# Patient Record
Sex: Male | Born: 1955 | Race: White | Hispanic: No | Marital: Married | Smoking: Never smoker
Health system: Southern US, Community
[De-identification: ages and names within clinical notes are randomized; demographics above are authoritative.]

## PROBLEM LIST (undated history)

## (undated) HISTORY — PX: KNEE SURGERY: SHX244

## (undated) HISTORY — PX: TONSILLECTOMY: SHX5217

---

## 2008-03-14 ENCOUNTER — Encounter: Admission: RE | Admit: 2008-03-14 | Discharge: 2008-03-14 | Payer: Self-pay | Admitting: Family Medicine

## 2008-10-26 ENCOUNTER — Encounter: Admission: RE | Admit: 2008-10-26 | Discharge: 2008-10-26 | Payer: Self-pay | Admitting: Family Medicine

## 2009-09-13 ENCOUNTER — Ambulatory Visit: Payer: Self-pay | Admitting: Genetic Counselor

## 2009-10-31 ENCOUNTER — Encounter: Admission: RE | Admit: 2009-10-31 | Discharge: 2009-10-31 | Payer: Self-pay | Admitting: Family Medicine

## 2010-09-23 ENCOUNTER — Other Ambulatory Visit: Payer: Self-pay | Admitting: Family Medicine

## 2010-09-23 DIAGNOSIS — D1800 Hemangioma unspecified site: Secondary | ICD-10-CM

## 2010-11-10 ENCOUNTER — Ambulatory Visit
Admission: RE | Admit: 2010-11-10 | Discharge: 2010-11-10 | Disposition: A | Discharge status: Home / Self Care | Payer: BC Managed Care – PPO | Source: Ambulatory Visit | Attending: Family Medicine | Admitting: Family Medicine

## 2010-11-10 DIAGNOSIS — D1800 Hemangioma unspecified site: Secondary | ICD-10-CM

## 2011-10-23 ENCOUNTER — Other Ambulatory Visit: Payer: Self-pay | Admitting: Family Medicine

## 2011-10-23 DIAGNOSIS — D1803 Hemangioma of intra-abdominal structures: Secondary | ICD-10-CM

## 2011-11-23 ENCOUNTER — Ambulatory Visit
Admission: RE | Admit: 2011-11-23 | Discharge: 2011-11-23 | Disposition: A | Discharge status: Home / Self Care | Payer: BC Managed Care – PPO | Source: Ambulatory Visit | Attending: Family Medicine | Admitting: Family Medicine

## 2011-11-23 DIAGNOSIS — D1803 Hemangioma of intra-abdominal structures: Secondary | ICD-10-CM

## 2012-11-09 ENCOUNTER — Other Ambulatory Visit: Payer: Self-pay | Admitting: Family Medicine

## 2012-11-09 DIAGNOSIS — D1803 Hemangioma of intra-abdominal structures: Secondary | ICD-10-CM

## 2012-11-09 DIAGNOSIS — Z8051 Family history of malignant neoplasm of kidney: Secondary | ICD-10-CM

## 2012-11-15 ENCOUNTER — Other Ambulatory Visit: Payer: Self-pay | Admitting: Family Medicine

## 2012-11-15 ENCOUNTER — Ambulatory Visit
Admission: RE | Admit: 2012-11-15 | Discharge: 2012-11-15 | Disposition: A | Discharge status: Home / Self Care | Payer: BC Managed Care – PPO | Source: Ambulatory Visit | Attending: Family Medicine | Admitting: Family Medicine

## 2012-11-15 ENCOUNTER — Ambulatory Visit: Admission: RE | Admit: 2012-11-15 | Payer: BC Managed Care – PPO | Source: Ambulatory Visit

## 2012-11-15 DIAGNOSIS — Z8051 Family history of malignant neoplasm of kidney: Secondary | ICD-10-CM

## 2012-11-15 DIAGNOSIS — D1803 Hemangioma of intra-abdominal structures: Secondary | ICD-10-CM

## 2014-01-04 ENCOUNTER — Other Ambulatory Visit: Payer: Self-pay | Admitting: Family Medicine

## 2014-01-04 DIAGNOSIS — D1803 Hemangioma of intra-abdominal structures: Secondary | ICD-10-CM

## 2014-02-22 ENCOUNTER — Ambulatory Visit
Admission: RE | Admit: 2014-02-22 | Discharge: 2014-02-22 | Disposition: A | Discharge status: Home / Self Care | Payer: BC Managed Care – PPO | Source: Ambulatory Visit | Attending: Family Medicine | Admitting: Family Medicine

## 2014-02-22 DIAGNOSIS — D1803 Hemangioma of intra-abdominal structures: Secondary | ICD-10-CM

## 2014-11-28 ENCOUNTER — Other Ambulatory Visit: Payer: Self-pay | Admitting: Family Medicine

## 2014-11-28 DIAGNOSIS — D1803 Hemangioma of intra-abdominal structures: Secondary | ICD-10-CM

## 2015-02-25 ENCOUNTER — Ambulatory Visit
Admission: RE | Admit: 2015-02-25 | Discharge: 2015-02-25 | Disposition: A | Discharge status: Home / Self Care | Payer: BLUE CROSS/BLUE SHIELD | Source: Ambulatory Visit | Attending: Family Medicine | Admitting: Family Medicine

## 2015-02-25 DIAGNOSIS — D1803 Hemangioma of intra-abdominal structures: Secondary | ICD-10-CM

## 2016-03-05 ENCOUNTER — Other Ambulatory Visit: Payer: Self-pay | Admitting: Family Medicine

## 2016-03-05 DIAGNOSIS — D1803 Hemangioma of intra-abdominal structures: Secondary | ICD-10-CM

## 2016-03-05 DIAGNOSIS — Z8051 Family history of malignant neoplasm of kidney: Secondary | ICD-10-CM

## 2016-04-17 ENCOUNTER — Ambulatory Visit
Admission: RE | Admit: 2016-04-17 | Discharge: 2016-04-17 | Disposition: A | Discharge status: Home / Self Care | Payer: BLUE CROSS/BLUE SHIELD | Source: Ambulatory Visit | Attending: Family Medicine | Admitting: Family Medicine

## 2016-04-17 DIAGNOSIS — D1803 Hemangioma of intra-abdominal structures: Secondary | ICD-10-CM

## 2016-04-17 DIAGNOSIS — Z8051 Family history of malignant neoplasm of kidney: Secondary | ICD-10-CM

## 2016-04-20 ENCOUNTER — Other Ambulatory Visit (HOSPITAL_COMMUNITY): Payer: Self-pay | Admitting: Urology

## 2016-04-20 DIAGNOSIS — D3000 Benign neoplasm of unspecified kidney: Secondary | ICD-10-CM

## 2016-04-23 ENCOUNTER — Ambulatory Visit (HOSPITAL_COMMUNITY)
Admission: RE | Admit: 2016-04-23 | Discharge: 2016-04-23 | Disposition: A | Discharge status: Home / Self Care | Payer: BLUE CROSS/BLUE SHIELD | Source: Ambulatory Visit | Attending: Urology | Admitting: Urology

## 2016-04-23 DIAGNOSIS — K760 Fatty (change of) liver, not elsewhere classified: Secondary | ICD-10-CM | POA: Diagnosis not present

## 2016-04-23 DIAGNOSIS — D3002 Benign neoplasm of left kidney: Secondary | ICD-10-CM | POA: Diagnosis not present

## 2016-04-23 DIAGNOSIS — D3000 Benign neoplasm of unspecified kidney: Secondary | ICD-10-CM

## 2016-04-23 MED ORDER — GADOBENATE DIMEGLUMINE 529 MG/ML IV SOLN
20.0000 mL | Freq: Once | INTRAVENOUS | Status: AC | PRN
  Administered 2016-04-23: 20 mL via INTRAVENOUS

## 2016-08-05 DIAGNOSIS — Z0100 Encounter for examination of eyes and vision without abnormal findings: Secondary | ICD-10-CM | POA: Diagnosis not present

## 2017-02-11 ENCOUNTER — Other Ambulatory Visit: Payer: Self-pay | Admitting: Family Medicine

## 2017-02-11 DIAGNOSIS — Z8051 Family history of malignant neoplasm of kidney: Secondary | ICD-10-CM

## 2017-04-14 ENCOUNTER — Ambulatory Visit
Admission: RE | Admit: 2017-04-14 | Discharge: 2017-04-14 | Disposition: A | Discharge status: Home / Self Care | Payer: 59 | Source: Ambulatory Visit | Attending: Family Medicine | Admitting: Family Medicine

## 2017-04-14 DIAGNOSIS — Z8051 Family history of malignant neoplasm of kidney: Secondary | ICD-10-CM

## 2017-11-01 IMAGING — US US ABDOMEN COMPLETE
1 series · 13 of 25 positions shown · non-contrast
Comparison: Abdominal ultrasound February 25, 2015 and February 22, 2014.

CLINICAL DATA: Family history of renal cell malignancy. History of
presumed hepatic hemangioma in the caudate lobe of the liver

EXAM:
ABDOMEN ULTRASOUND COMPLETE

[Series 1: us abdomen complete · 0.32mm/px · 13 of 88 slices shown]
[im 1/88]
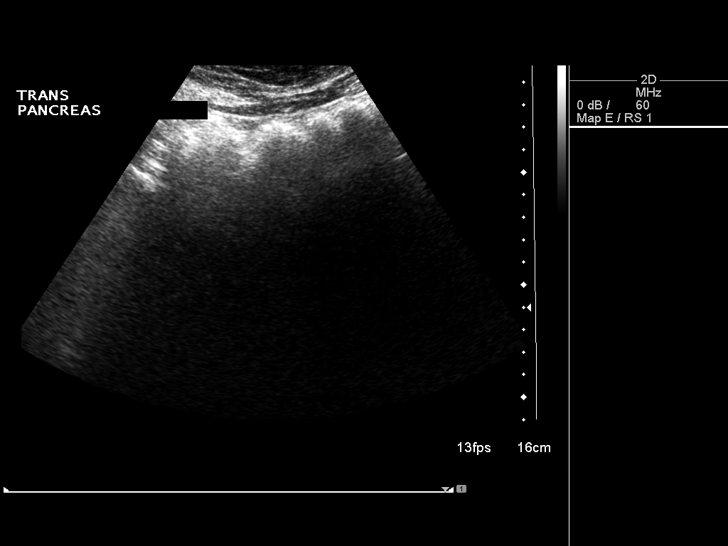
[im 8/88]
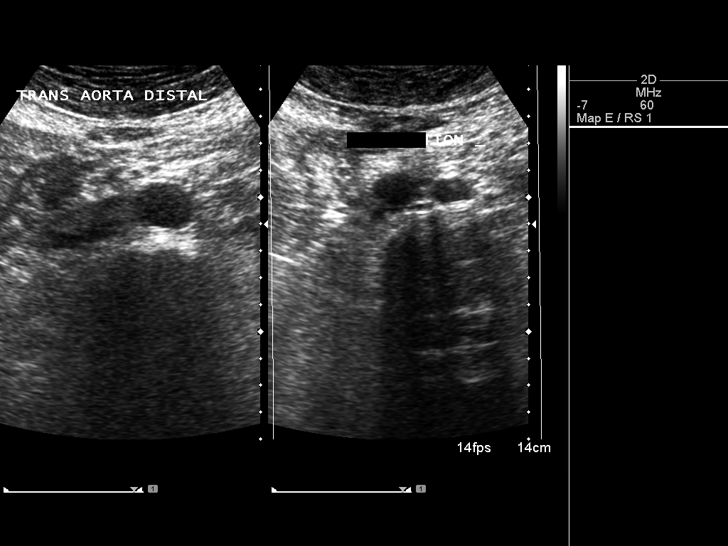
[im 15/88]
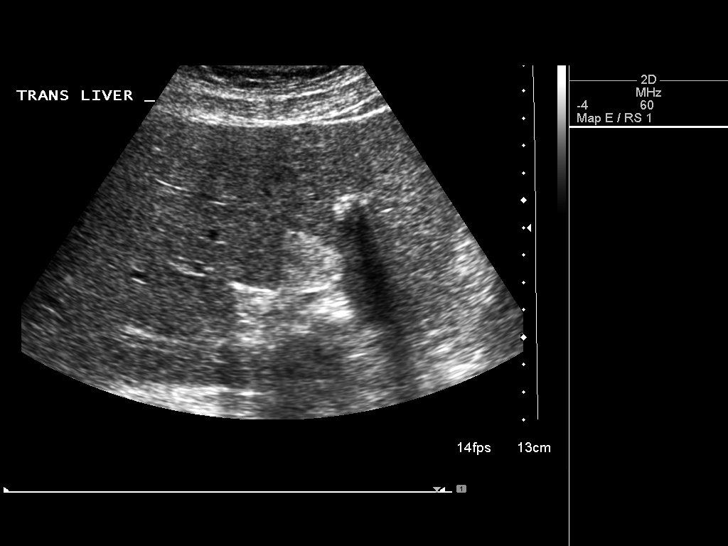
[im 22/88]
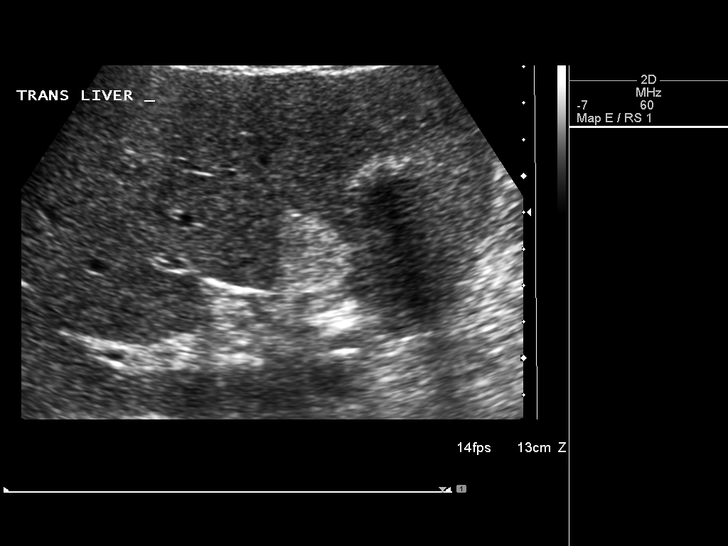
[im 30/88]
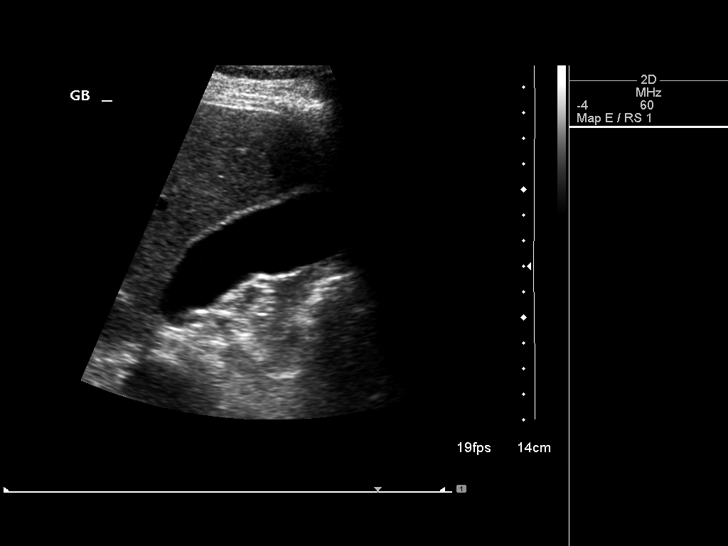
[im 37/88]
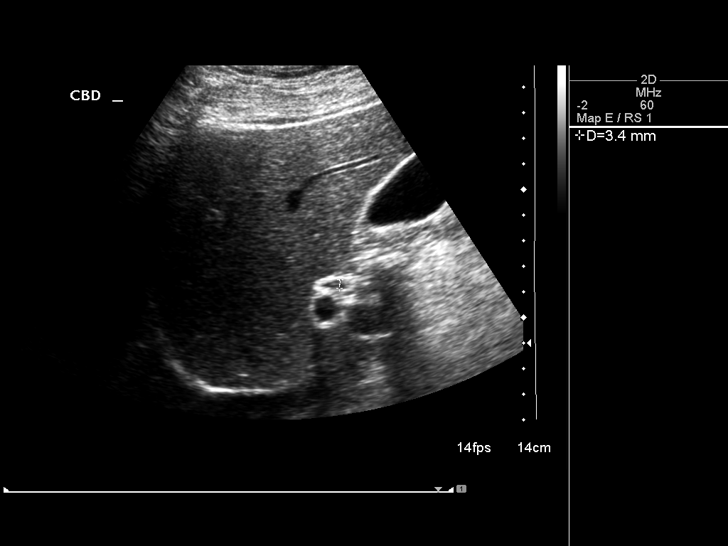
[im 44/88]
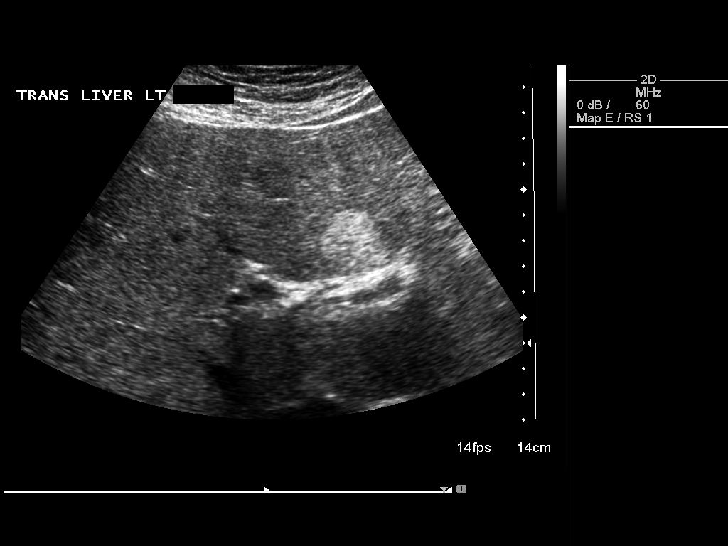
[im 51/88]
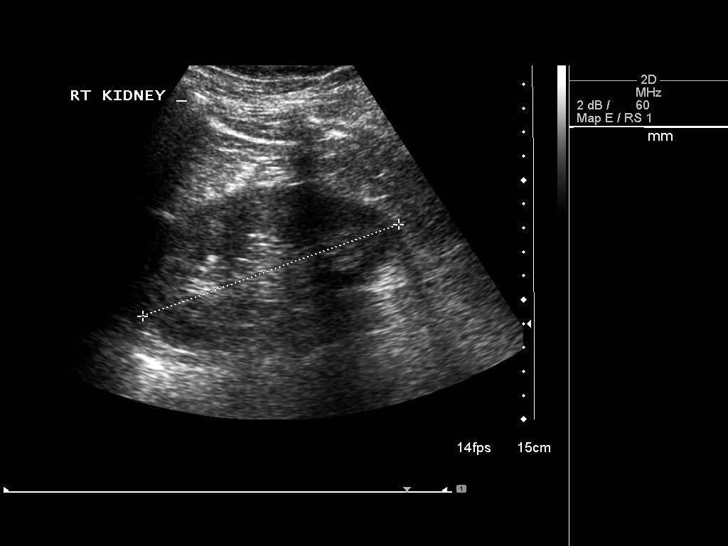
[im 59/88]
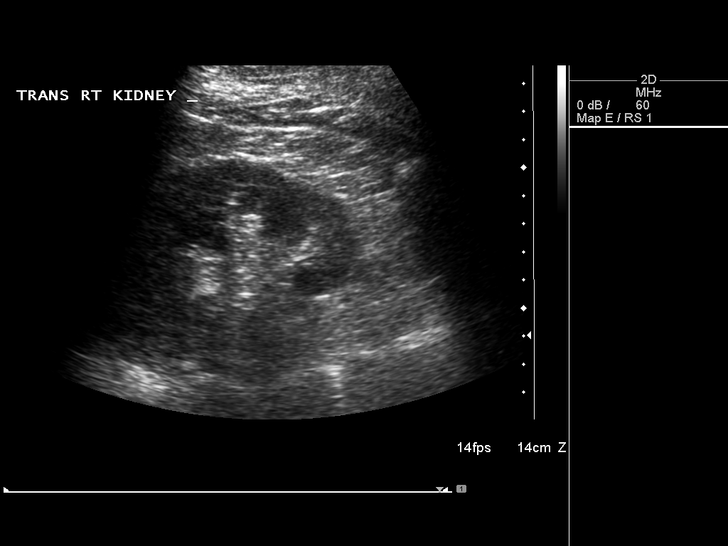
[im 66/88]
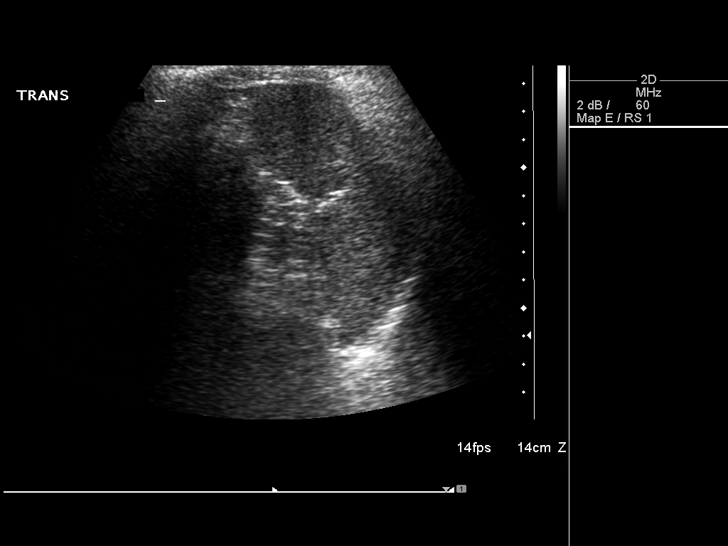
[im 73/88]
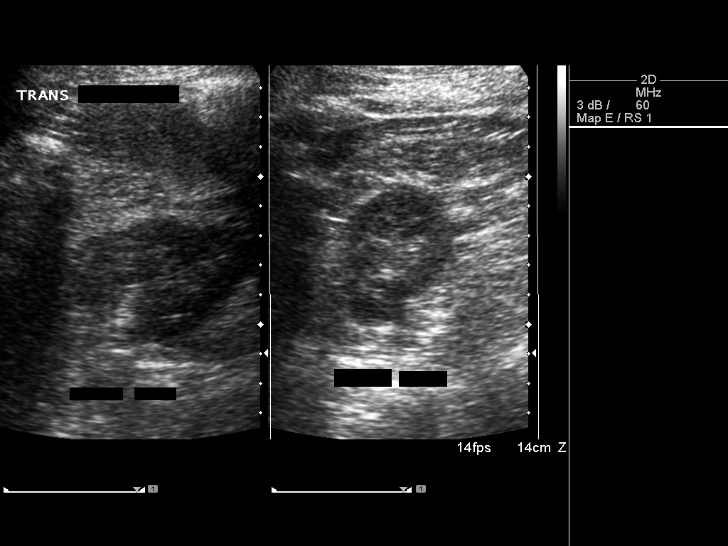
[im 80/88]
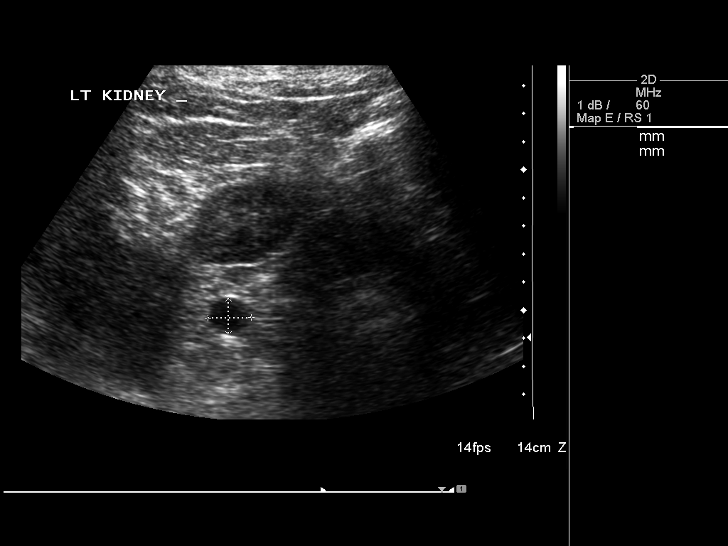
[im 88/88]
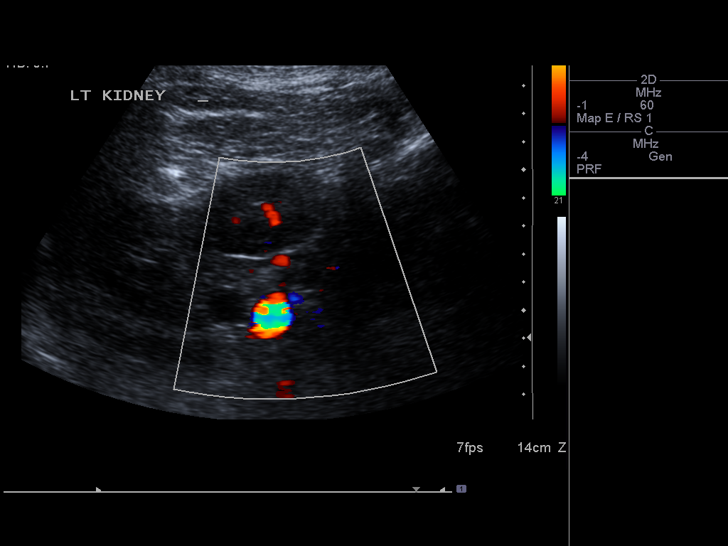

[13 of 25 positions shown; findings below may reference images not displayed]

FINDINGS: Gallbladder: No gallstones or wall thickening visualized. No
sonographic Murphy sign noted by sonographer.

Common bile duct: Diameter: 3.4 mm

Liver: In the caudate lobe of the liver there is a 3.2 x 1.9 x
cm solid hyperechoic focus. It is slightly larger than on the 8423
in 9213 studies but is similar to or smaller than that seen in 8984.
The remainder the liver is unremarkable.

IVC: Partially obscured by bowel gas.

Pancreas: The pancreas was obscured by bowel gas.

Spleen: Size and appearance within normal limits.

Right Kidney: Length: 11.4 cm. Echogenicity within normal limits. No
mass or hydronephrosis visualized.

Left Kidney: Length: 11.6 cm. The renal cortical echotexture is
normal. There is a 1.5 x 1.3 x 1.4 cm cyst in the medial aspect of
the midpole with a subtle 2 mm hyperechoic mural nodule or
calcification. There is no hydronephrosis.

Abdominal aorta: Bowel gas obscures the proximal aorta. The
remainder appears normal.

Other findings: No ascites.
IMPRESSION: Again demonstrated is the hyperechoic caudate lobe mass presumed to
be a hemangioma. It is slightly larger than on the 8423 in 9213
studies but is similar tour smaller than that seen in 8984.

**An incidental finding of potential clinical significance has been
found. There is a 1.5 cm maximal dimension cystic structure with a
tinymural nodule noted medially in the mid pole of the left kidney
that has not been previously described. Given the patient's strong
family history of renal cell malignancy, renal protocol MRI or CT
scanning is recommended further evaluate this structure. **

These results will be called to the ordering clinician or
representative by the Radiologist Assistant, and communication
documented in the PACS or zVision Dashboard.

## 2018-05-17 ENCOUNTER — Other Ambulatory Visit: Payer: Self-pay | Admitting: Family Medicine

## 2018-05-17 DIAGNOSIS — Z8051 Family history of malignant neoplasm of kidney: Secondary | ICD-10-CM

## 2018-05-26 ENCOUNTER — Other Ambulatory Visit: Payer: 59

## 2018-06-10 ENCOUNTER — Ambulatory Visit
Admission: RE | Admit: 2018-06-10 | Discharge: 2018-06-10 | Disposition: A | Discharge status: Home / Self Care | Payer: BLUE CROSS/BLUE SHIELD | Source: Ambulatory Visit | Attending: Family Medicine | Admitting: Family Medicine

## 2018-06-10 DIAGNOSIS — Z0000 Encounter for general adult medical examination without abnormal findings: Secondary | ICD-10-CM | POA: Diagnosis not present

## 2018-06-10 DIAGNOSIS — K760 Fatty (change of) liver, not elsewhere classified: Secondary | ICD-10-CM | POA: Diagnosis not present

## 2018-06-10 DIAGNOSIS — R7309 Other abnormal glucose: Secondary | ICD-10-CM | POA: Diagnosis not present

## 2018-06-10 DIAGNOSIS — Z8051 Family history of malignant neoplasm of kidney: Secondary | ICD-10-CM

## 2018-06-10 DIAGNOSIS — Z13220 Encounter for screening for lipoid disorders: Secondary | ICD-10-CM | POA: Diagnosis not present

## 2018-06-10 DIAGNOSIS — E039 Hypothyroidism, unspecified: Secondary | ICD-10-CM | POA: Diagnosis not present

## 2018-06-10 DIAGNOSIS — N281 Cyst of kidney, acquired: Secondary | ICD-10-CM | POA: Diagnosis not present

## 2018-06-10 DIAGNOSIS — Z125 Encounter for screening for malignant neoplasm of prostate: Secondary | ICD-10-CM | POA: Diagnosis not present

## 2018-06-13 ENCOUNTER — Encounter: Payer: Self-pay | Admitting: Urology

## 2018-09-23 DIAGNOSIS — Z0100 Encounter for examination of eyes and vision without abnormal findings: Secondary | ICD-10-CM | POA: Diagnosis not present

## 2018-11-28 DIAGNOSIS — M2550 Pain in unspecified joint: Secondary | ICD-10-CM | POA: Diagnosis not present

## 2019-01-20 DIAGNOSIS — M2550 Pain in unspecified joint: Secondary | ICD-10-CM | POA: Diagnosis not present

## 2019-01-25 DIAGNOSIS — M25512 Pain in left shoulder: Secondary | ICD-10-CM | POA: Diagnosis not present

## 2019-01-25 DIAGNOSIS — M25511 Pain in right shoulder: Secondary | ICD-10-CM | POA: Diagnosis not present

## 2019-03-13 DIAGNOSIS — R768 Other specified abnormal immunological findings in serum: Secondary | ICD-10-CM | POA: Diagnosis not present

## 2019-03-13 DIAGNOSIS — M150 Primary generalized (osteo)arthritis: Secondary | ICD-10-CM | POA: Diagnosis not present

## 2019-06-06 DIAGNOSIS — M25511 Pain in right shoulder: Secondary | ICD-10-CM | POA: Diagnosis not present

## 2019-07-07 DIAGNOSIS — Z86010 Personal history of colonic polyps: Secondary | ICD-10-CM | POA: Diagnosis not present

## 2019-07-07 DIAGNOSIS — K6289 Other specified diseases of anus and rectum: Secondary | ICD-10-CM | POA: Diagnosis not present

## 2019-07-13 DIAGNOSIS — M24111 Other articular cartilage disorders, right shoulder: Secondary | ICD-10-CM | POA: Diagnosis not present

## 2019-07-13 DIAGNOSIS — G8918 Other acute postprocedural pain: Secondary | ICD-10-CM | POA: Diagnosis not present

## 2019-07-13 DIAGNOSIS — S43431A Superior glenoid labrum lesion of right shoulder, initial encounter: Secondary | ICD-10-CM | POA: Diagnosis not present

## 2019-07-13 DIAGNOSIS — M7541 Impingement syndrome of right shoulder: Secondary | ICD-10-CM | POA: Diagnosis not present

## 2019-07-13 DIAGNOSIS — S46191A Other injury of muscle, fascia and tendon of long head of biceps, right arm, initial encounter: Secondary | ICD-10-CM | POA: Diagnosis not present

## 2019-07-13 DIAGNOSIS — M7551 Bursitis of right shoulder: Secondary | ICD-10-CM | POA: Diagnosis not present

## 2019-07-13 DIAGNOSIS — S46011A Strain of muscle(s) and tendon(s) of the rotator cuff of right shoulder, initial encounter: Secondary | ICD-10-CM | POA: Diagnosis not present

## 2019-07-13 DIAGNOSIS — M7521 Bicipital tendinitis, right shoulder: Secondary | ICD-10-CM | POA: Diagnosis not present

## 2019-07-13 DIAGNOSIS — M75111 Incomplete rotator cuff tear or rupture of right shoulder, not specified as traumatic: Secondary | ICD-10-CM | POA: Diagnosis not present

## 2019-07-19 DIAGNOSIS — M25511 Pain in right shoulder: Secondary | ICD-10-CM | POA: Diagnosis not present

## 2019-07-19 DIAGNOSIS — M25611 Stiffness of right shoulder, not elsewhere classified: Secondary | ICD-10-CM | POA: Diagnosis not present

## 2019-07-26 DIAGNOSIS — Z4789 Encounter for other orthopedic aftercare: Secondary | ICD-10-CM | POA: Diagnosis not present

## 2019-08-02 DIAGNOSIS — M25611 Stiffness of right shoulder, not elsewhere classified: Secondary | ICD-10-CM | POA: Diagnosis not present

## 2019-08-02 DIAGNOSIS — M25511 Pain in right shoulder: Secondary | ICD-10-CM | POA: Diagnosis not present

## 2019-08-24 DIAGNOSIS — M25611 Stiffness of right shoulder, not elsewhere classified: Secondary | ICD-10-CM | POA: Diagnosis not present

## 2019-08-24 DIAGNOSIS — M25511 Pain in right shoulder: Secondary | ICD-10-CM | POA: Diagnosis not present

## 2019-10-13 ENCOUNTER — Other Ambulatory Visit: Payer: Self-pay | Admitting: Family Medicine

## 2019-10-13 DIAGNOSIS — Z8051 Family history of malignant neoplasm of kidney: Secondary | ICD-10-CM

## 2019-11-03 ENCOUNTER — Ambulatory Visit
Admission: RE | Admit: 2019-11-03 | Discharge: 2019-11-03 | Disposition: A | Discharge status: Home / Self Care | Payer: No Typology Code available for payment source | Source: Ambulatory Visit | Attending: Family Medicine | Admitting: Family Medicine

## 2019-11-03 DIAGNOSIS — Z8051 Family history of malignant neoplasm of kidney: Secondary | ICD-10-CM

## 2020-11-25 ENCOUNTER — Other Ambulatory Visit (HOSPITAL_COMMUNITY): Payer: Self-pay | Admitting: *Deleted

## 2020-11-29 ENCOUNTER — Ambulatory Visit (HOSPITAL_BASED_OUTPATIENT_CLINIC_OR_DEPARTMENT_OTHER)
Admission: RE | Admit: 2020-11-29 | Discharge: 2020-11-29 | Disposition: A | Discharge status: Home / Self Care | Payer: No Typology Code available for payment source | Source: Ambulatory Visit | Attending: Cardiology | Admitting: Cardiology

## 2020-11-29 ENCOUNTER — Other Ambulatory Visit: Payer: Self-pay

## 2020-12-06 ENCOUNTER — Other Ambulatory Visit: Payer: Self-pay | Admitting: Internal Medicine

## 2020-12-06 DIAGNOSIS — Z8051 Family history of malignant neoplasm of kidney: Secondary | ICD-10-CM

## 2021-01-03 ENCOUNTER — Ambulatory Visit
Admission: RE | Admit: 2021-01-03 | Discharge: 2021-01-03 | Disposition: A | Discharge status: Home / Self Care | Payer: No Typology Code available for payment source | Source: Ambulatory Visit | Attending: Internal Medicine | Admitting: Internal Medicine

## 2021-01-03 DIAGNOSIS — Z8051 Family history of malignant neoplasm of kidney: Secondary | ICD-10-CM

## 2022-06-15 IMAGING — CT CT CARDIAC CORONARY ARTERY CALCIUM SCORE
2 series · 16 of 20 positions shown, 18 images · non-contrast
Comparison: Chest radiograph 11/10/2010
COMPARISON: Chest radiograph 11/10/2010

Addendum:
EXAM:
OVER-READ INTERPRETATION  CT CHEST

The following report is an over-read performed by radiologist Dr.
Melynda Billiot [REDACTED] on 11/29/2020. This over-read
does not include interpretation of cardiac or coronary anatomy or
pathology. The calcium score interpretation by the cardiologist is
attached.
CLINICAL DATA: 64M for risk stratification
Coronary Calcium Score
TECHNIQUE: The patient was scanned on a Siemens Force scanner. Axial
non-contrast 3 mm slices were carried out through the heart. The
data set was analyzed on a dedicated work station and scored using
the Agatson method.

[Series 2: casc 3.0 i36f 2 bestdiast 72 % · axial · 0.46mm/px · z∈[-280,-178]mm · 8 of 44 slices shown, 10 images]
[im 5/44  vessel]
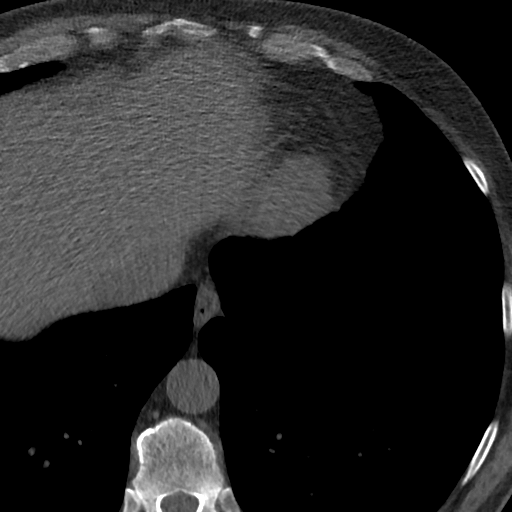
[im 5/44  lung]
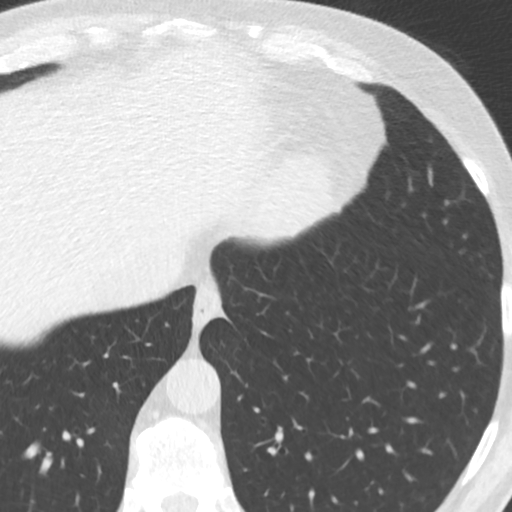
[im 10/44  vessel]
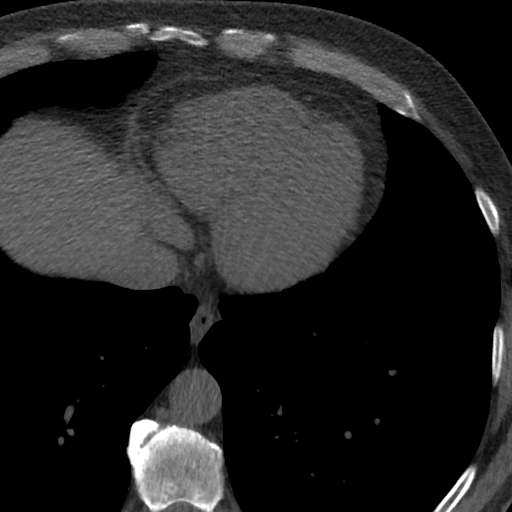
[im 15/44  vessel]
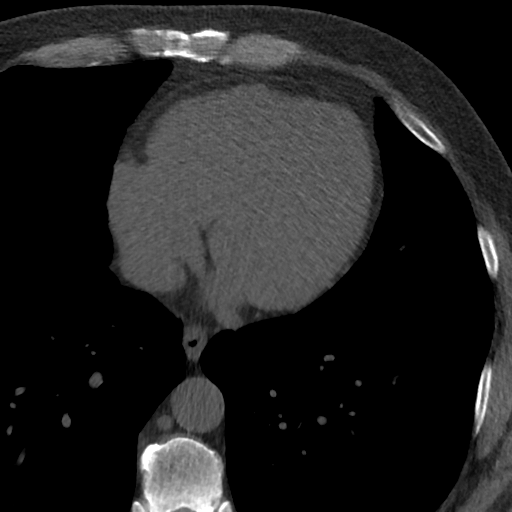
[im 20/44  vessel]
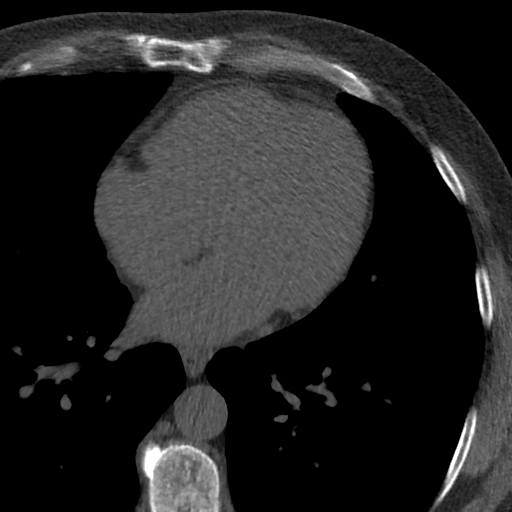
[im 24/44  vessel]
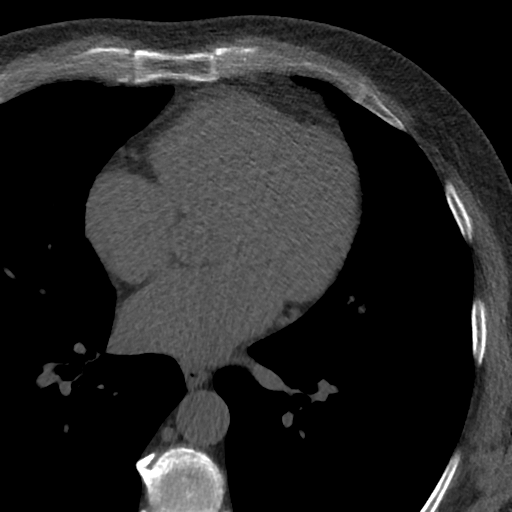
[im 24/44  lung]
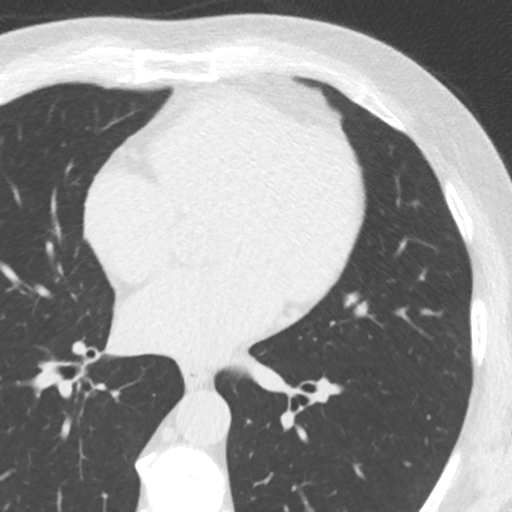
[im 29/44  vessel]
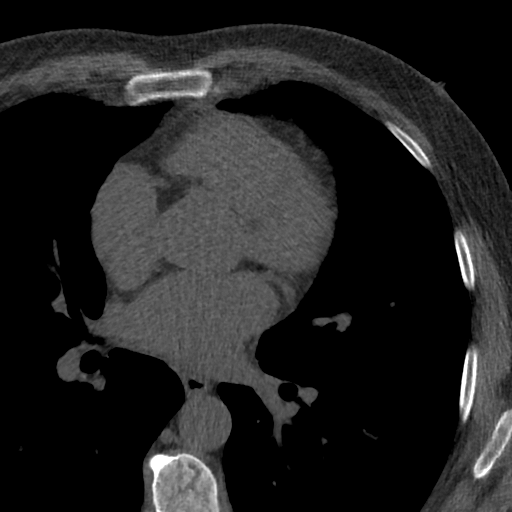
[im 34/44  vessel]
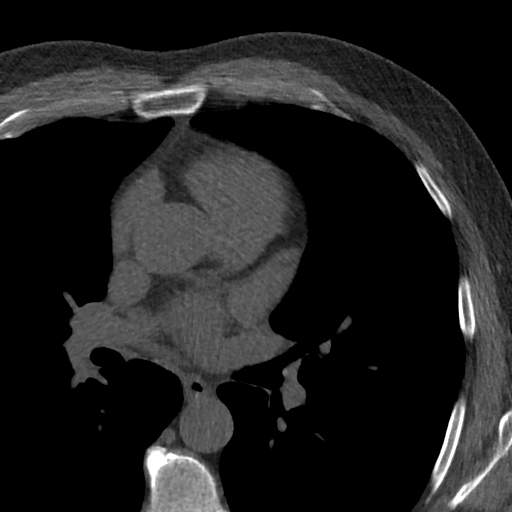
[im 39/44  vessel]
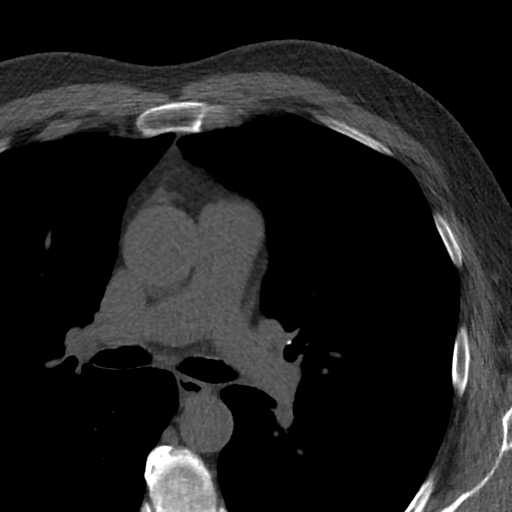

[Series 4: lung st 72 % · axial · 0.83mm/px · z∈[-280,-178]mm · 8 of 44 slices shown]
[im 5/44  lung]
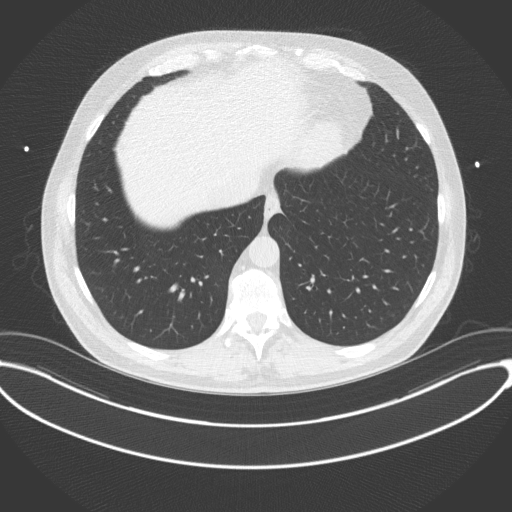
[im 10/44  lung]
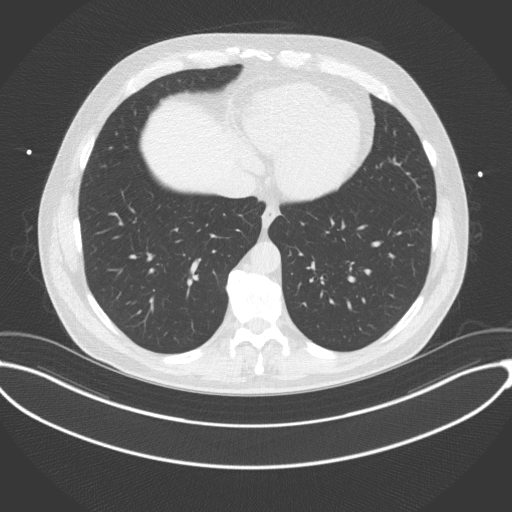
[im 15/44  lung]
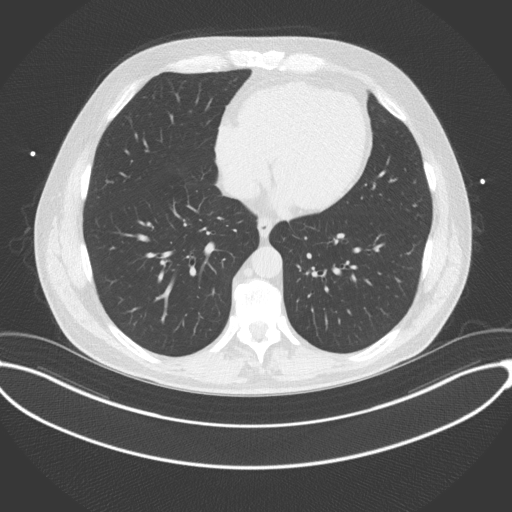
[im 20/44  lung]
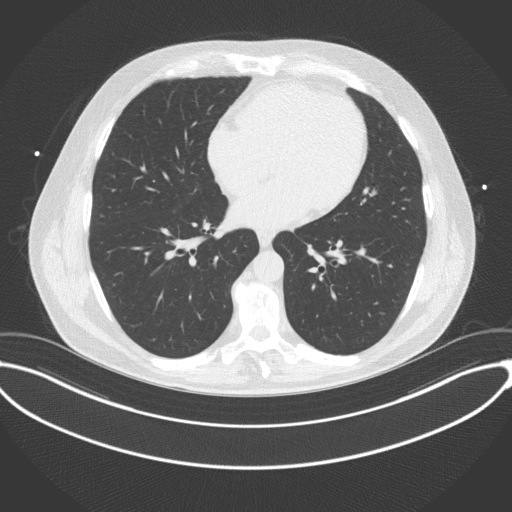
[im 24/44  lung]
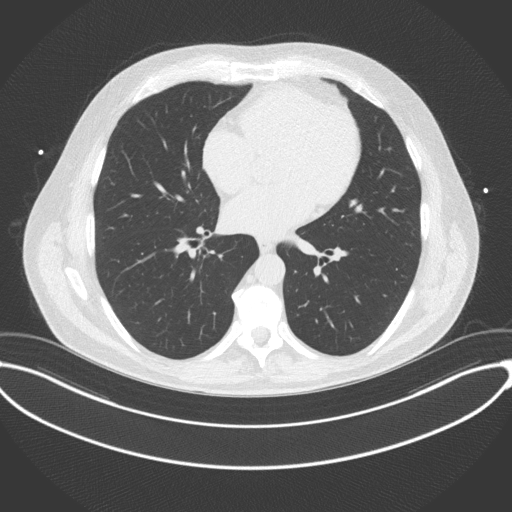
[im 29/44  lung]
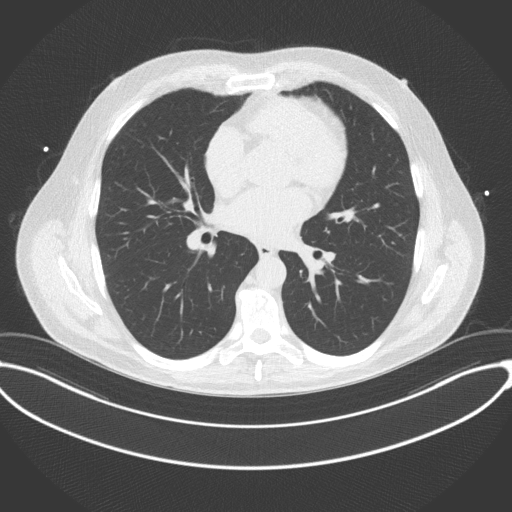
[im 34/44  lung]
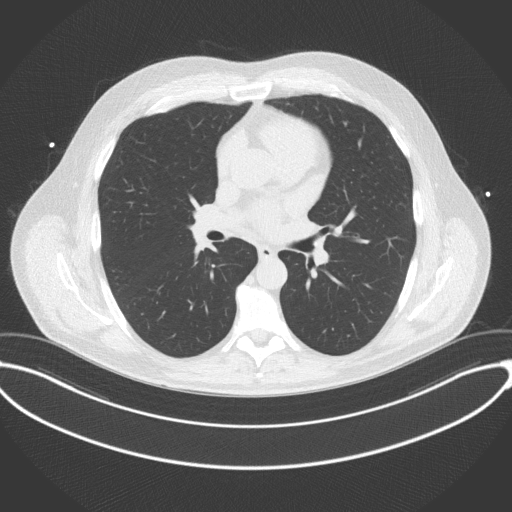
[im 39/44  lung]
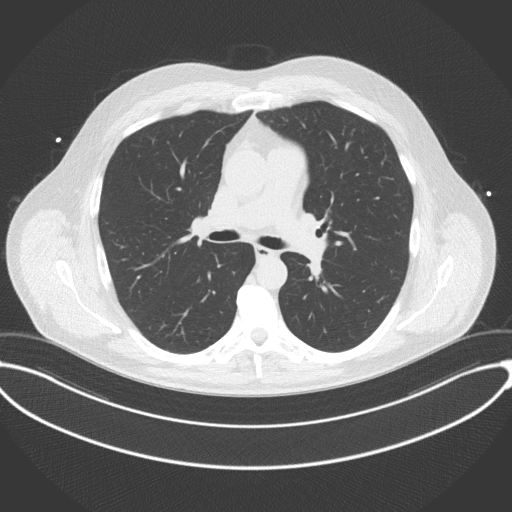

[16 of 20 positions shown; findings below may reference images not displayed]

FINDINGS: Vascular: Normal aortic caliber.

Mediastinum/Nodes: No imaged thoracic adenopathy.

Lungs/Pleura: No pleural fluid. Right middle lobe subpleural 2 mm
pulmonary nodule on [DATE].

More caudal right middle lobe 2 mm nodule on 38/3.

Upper Abdomen: Normal imaged portions of the liver, stomach.

Musculoskeletal: No acute osseous abnormality.
IMPRESSION: 1. No acute findings in the imaged extracardiac chest.
2. Tiny right middle lobe pulmonary nodules which are likely
subpleural lymph nodes. No follow-up needed if patient is low-risk.
Non-contrast chest CT can be considered in 12 months if patient is
high-risk. This recommendation follows the consensus statement:
Guidelines for Management of Incidental Pulmonary Nodules Detected
FINDINGS: Non-cardiac: See separate report from [REDACTED].

Ascending Aorta: Ascending aorta normal caliber.

Pericardium: Normal

Coronary arteries: Normal origins and anatomy. Left dominant. Scant
calcification in the proximal LAD.
IMPRESSION: Coronary calcium score of 1.51. This was 24th percentile for age and
sex matched control.

LM: 0
LAD:

LCX: 0
RCA: 0

*** End of Addendum ***
EXAM:
OVER-READ INTERPRETATION  CT CHEST

The following report is an over-read performed by radiologist Dr.
Melynda Billiot [REDACTED] on 11/29/2020. This over-read
does not include interpretation of cardiac or coronary anatomy or
pathology. The calcium score interpretation by the cardiologist is
attached.
FINDINGS: Vascular: Normal aortic caliber.

Mediastinum/Nodes: No imaged thoracic adenopathy.

Lungs/Pleura: No pleural fluid. Right middle lobe subpleural 2 mm
pulmonary nodule on [DATE].

More caudal right middle lobe 2 mm nodule on 38/3.

Upper Abdomen: Normal imaged portions of the liver, stomach.

Musculoskeletal: No acute osseous abnormality.
IMPRESSION: 1. No acute findings in the imaged extracardiac chest.
2. Tiny right middle lobe pulmonary nodules which are likely
subpleural lymph nodes. No follow-up needed if patient is low-risk.
Non-contrast chest CT can be considered in 12 months if patient is
high-risk. This recommendation follows the consensus statement:
Guidelines for Management of Incidental Pulmonary Nodules Detected

## 2022-07-20 IMAGING — US US ABDOMEN COMPLETE
1 series · 13 of 25 positions shown · non-contrast
Comparison: 11/03/2019

CLINICAL DATA: Follow-up renal cyst

EXAM:
ABDOMEN ULTRASOUND COMPLETE

[Series 1: us abdomen complete · 0.18mm/px · 88 acquisitions, 13 frames shown]
[im 1/88]
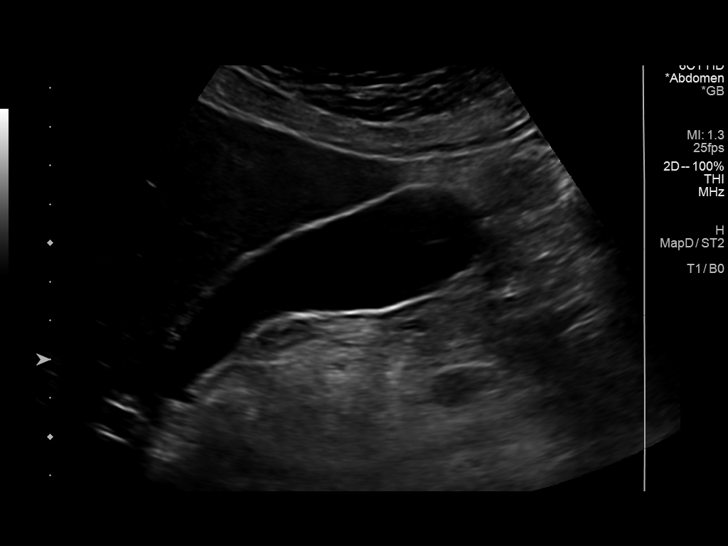
[im 8/88]
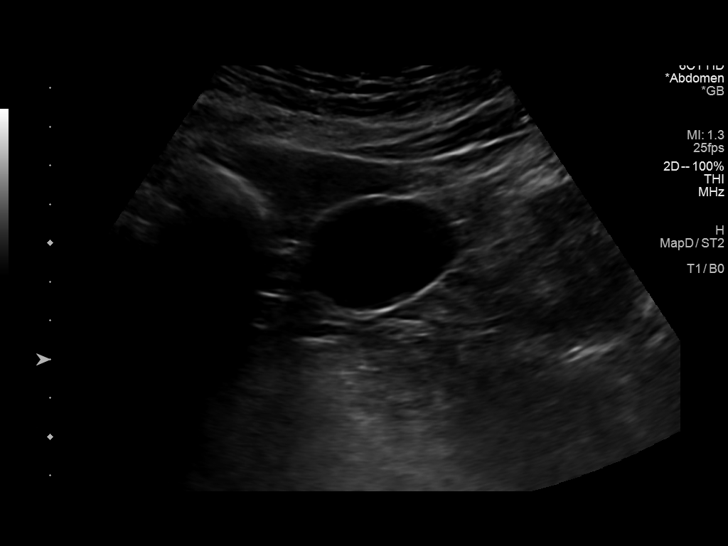
[im 15/88]
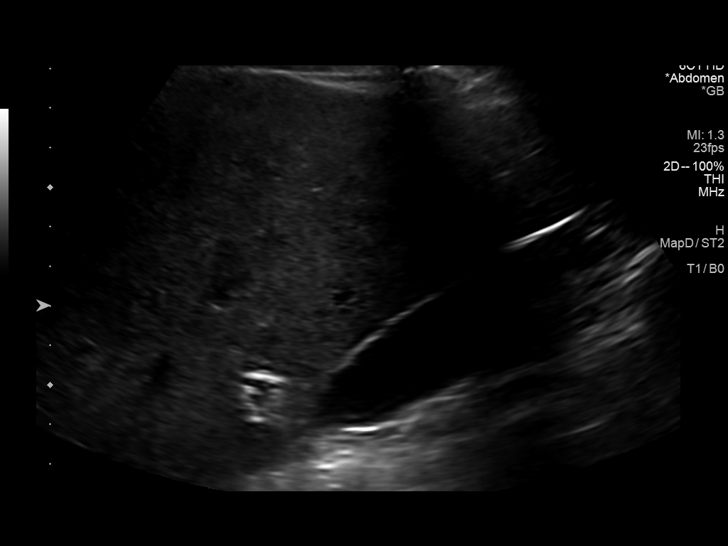
[im 22/88]
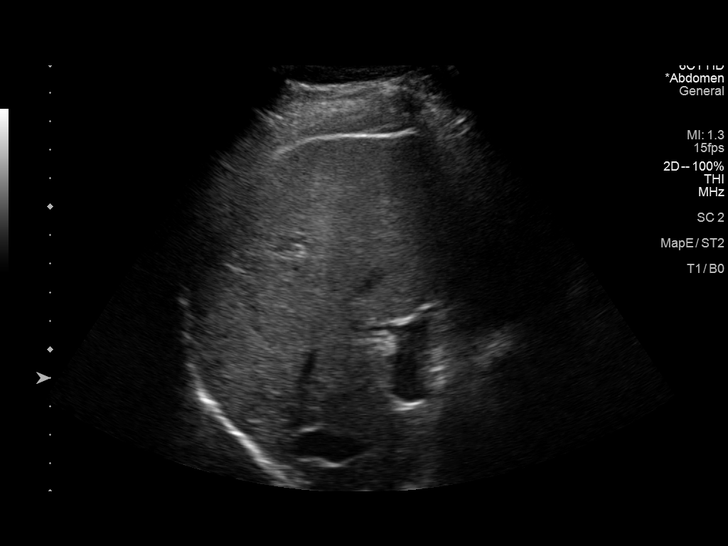
[im 30/88]
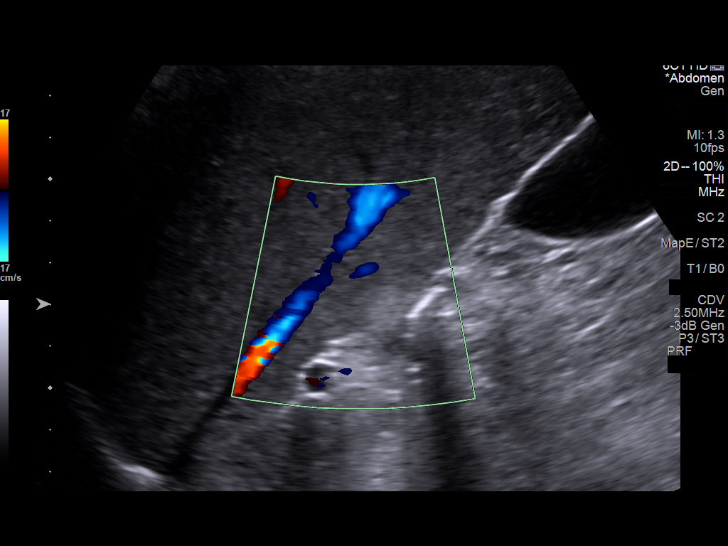
[im 37/88]
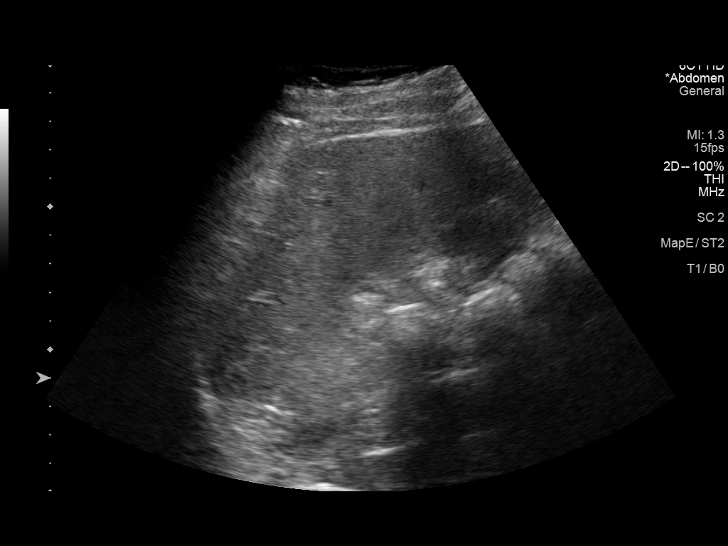
[im 44/88]
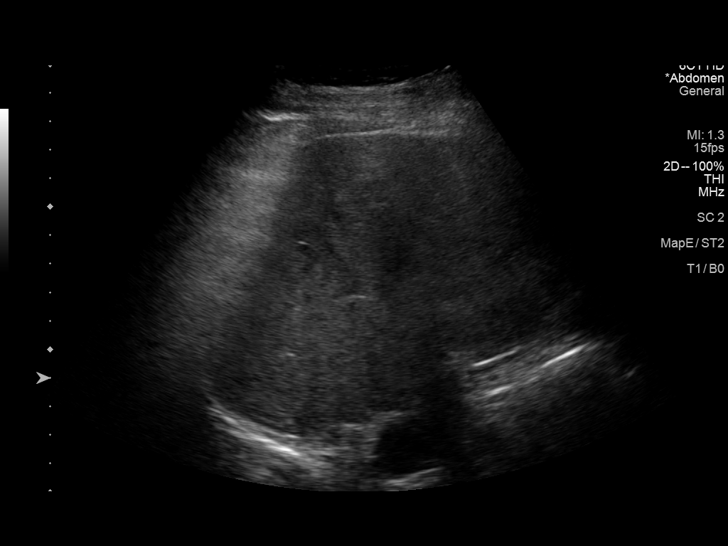
[im 51/88]
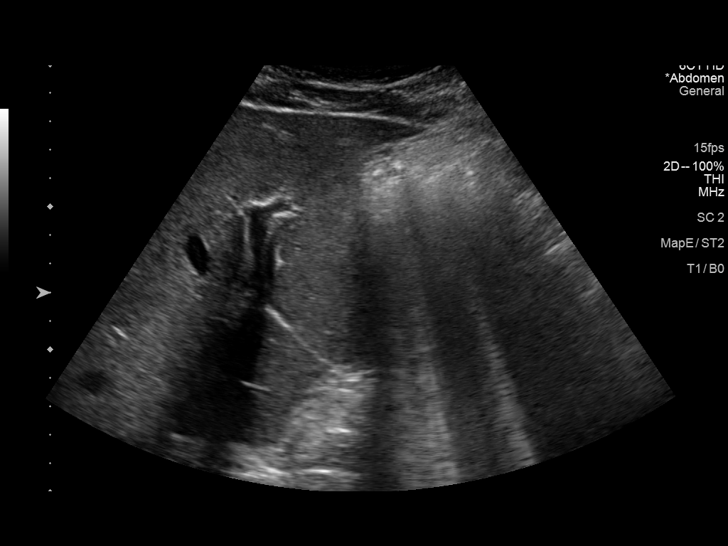
[im 59/88]
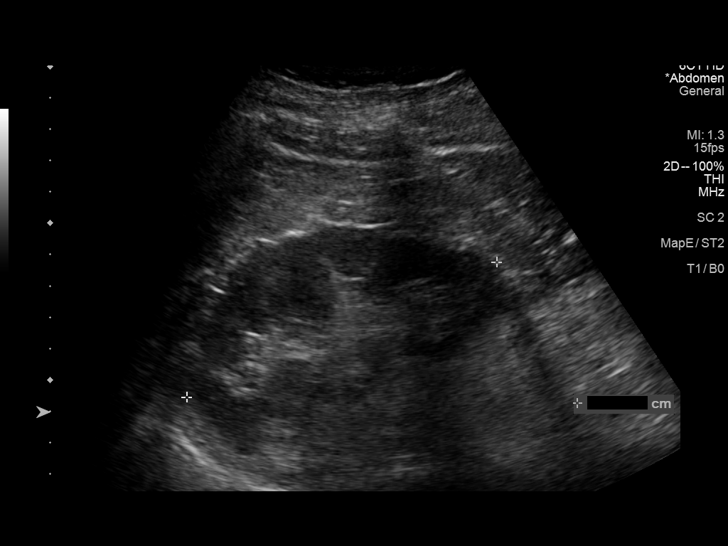
[im 66/88]
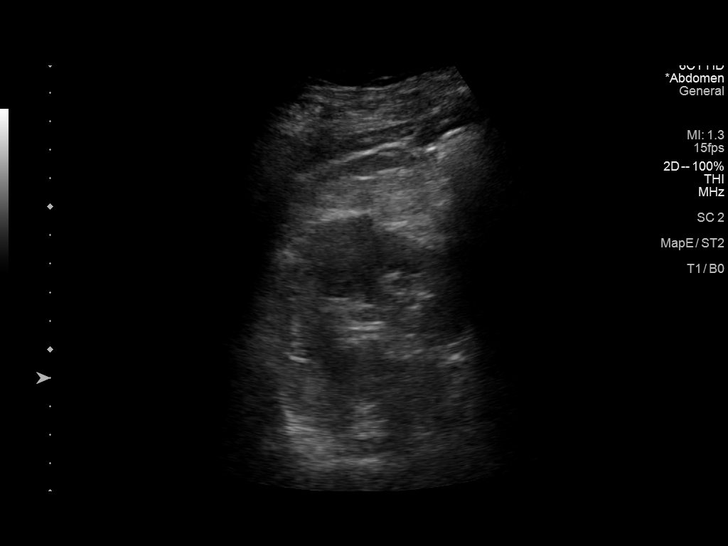
[im 73/88]
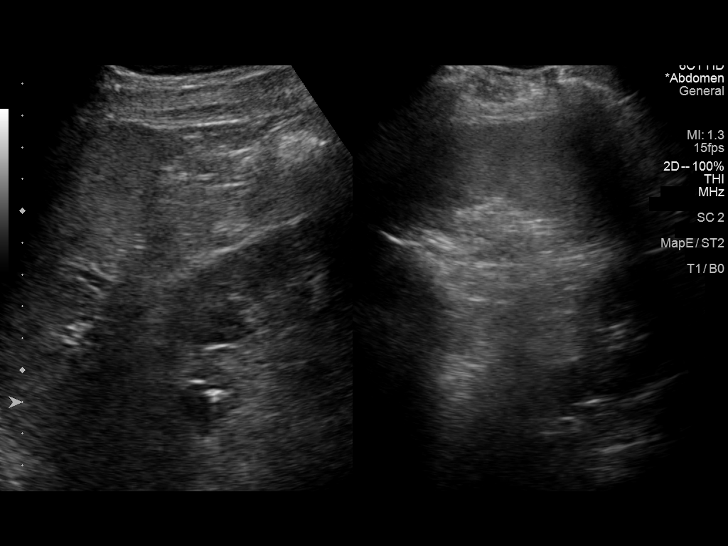
[im 80/88]
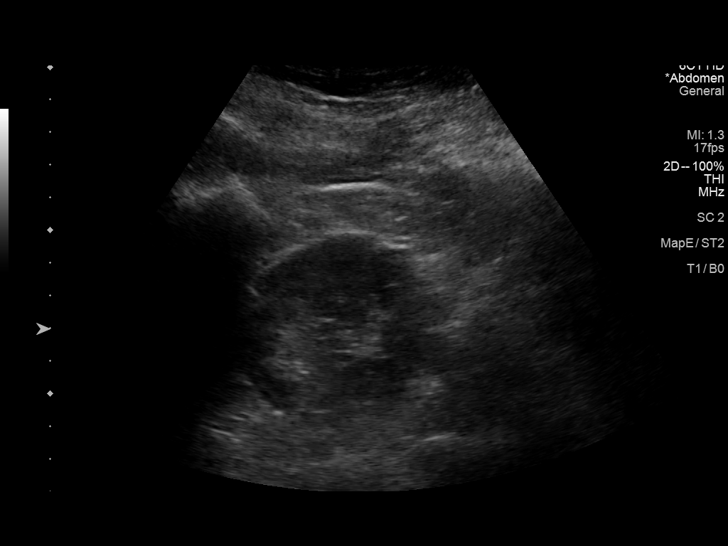
[im 88/88]
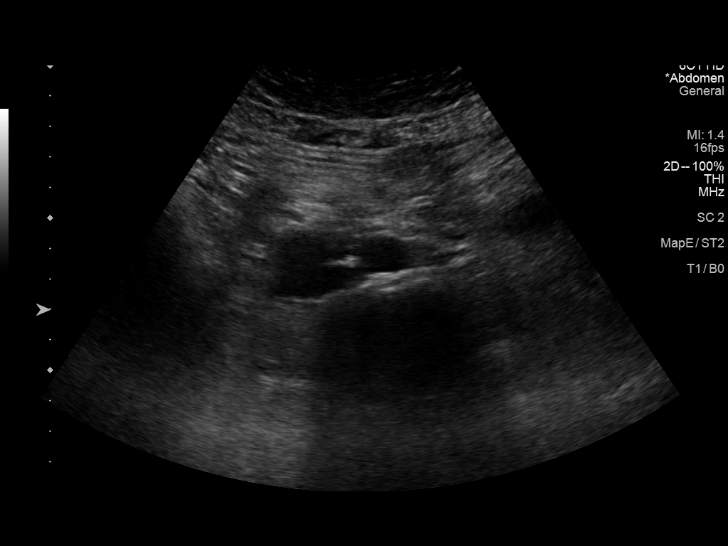

[13 of 25 positions shown; findings below may reference images not displayed]

FINDINGS: Gallbladder: No gallstones or wall thickening visualized. No
sonographic Murphy sign noted by sonographer.

Common bile duct: Diameter: 3.6 mm.

Liver: Mildly heterogeneous without focal mass. A hyperechoic focus
is noted measuring 3.4 x 2.1 cm. This is stable in appearance from
prior ultrasound as well as MRI from 3762 consistent with focal
fatty infiltration. Portal vein is patent on color Doppler imaging
with normal direction of blood flow towards the liver.

IVC: No abnormality visualized.

Pancreas: Visualized portion unremarkable.

Spleen: Size and appearance within normal limits.

Right Kidney: Length: 10.8 cm. Echogenicity within normal limits.
No mass or hydronephrosis visualized.

Left Kidney: Length: 11.6 cm. Echogenicity within normal limits. No
mass or hydronephrosis visualized. 2 cm mildly complex cyst is noted
in the upper pole of the left kidney. This is similar to that seen
on prior MRI and ultrasound examination. No findings to suggest
neoplasm are noted.

Abdominal aorta: No aneurysm visualized.

Other findings: None.
IMPRESSION: Stable mildly complex left renal cyst.

Stable focal fatty infiltration of the liver.

Mild heterogeneity of the liver is noted without other mass.

## 2022-10-29 ENCOUNTER — Other Ambulatory Visit: Payer: Self-pay | Admitting: Internal Medicine

## 2022-10-29 DIAGNOSIS — N281 Cyst of kidney, acquired: Secondary | ICD-10-CM

## 2022-11-26 ENCOUNTER — Other Ambulatory Visit: Payer: Self-pay | Admitting: Internal Medicine

## 2022-11-26 ENCOUNTER — Ambulatory Visit
Admission: RE | Admit: 2022-11-26 | Discharge: 2022-11-26 | Disposition: A | Discharge status: Home / Self Care | Payer: No Typology Code available for payment source | Source: Ambulatory Visit | Attending: Internal Medicine | Admitting: Internal Medicine

## 2022-11-26 DIAGNOSIS — N281 Cyst of kidney, acquired: Secondary | ICD-10-CM

## 2022-11-27 ENCOUNTER — Other Ambulatory Visit: Payer: Self-pay | Admitting: Internal Medicine

## 2022-11-27 DIAGNOSIS — N281 Cyst of kidney, acquired: Secondary | ICD-10-CM
